# Patient Record
Sex: Female | Born: 1995 | Hispanic: No | Marital: Married | Smoking: Never smoker
Health system: Southern US, Community
[De-identification: ages and names within clinical notes are randomized; demographics above are authoritative.]

## PROBLEM LIST (undated history)

## (undated) DIAGNOSIS — E039 Hypothyroidism, unspecified: Secondary | ICD-10-CM

## (undated) DIAGNOSIS — J45909 Unspecified asthma, uncomplicated: Secondary | ICD-10-CM

---
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
## 2016-10-19 ENCOUNTER — Encounter (HOSPITAL_COMMUNITY): Payer: Self-pay | Admitting: Emergency Medicine

## 2016-10-19 ENCOUNTER — Emergency Department (HOSPITAL_COMMUNITY): Payer: Medicaid Other

## 2016-10-19 ENCOUNTER — Emergency Department (HOSPITAL_COMMUNITY)
Admission: EM | Admit: 2016-10-19 | Discharge: 2016-10-19 | Disposition: A | Discharge status: Home / Self Care | Payer: Medicaid Other | Attending: Emergency Medicine | Admitting: Emergency Medicine

## 2016-10-19 DIAGNOSIS — J45909 Unspecified asthma, uncomplicated: Secondary | ICD-10-CM | POA: Insufficient documentation

## 2016-10-19 DIAGNOSIS — M545 Low back pain, unspecified: Secondary | ICD-10-CM

## 2016-10-19 DIAGNOSIS — M7918 Myalgia, other site: Secondary | ICD-10-CM

## 2016-10-19 HISTORY — DX: Unspecified asthma, uncomplicated: J45.909

## 2016-10-19 LAB — POC URINE PREG, ED: PREG TEST UR: NEGATIVE

## 2016-10-19 MED ORDER — METHOCARBAMOL 500 MG PO TABS: 500.0000 mg | ORAL_TABLET | Freq: Two times a day (BID) | ORAL | 0 refills | Status: AC

## 2016-10-19 MED ORDER — IBUPROFEN 600 MG PO TABS: 600.0000 mg | ORAL_TABLET | Freq: Four times a day (QID) | ORAL | 0 refills | Status: AC | PRN
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
## 2016-10-19 NOTE — ED Notes (Signed)
Given water to drink for urine sample
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
## 2016-10-19 NOTE — ED Notes (Signed)
Papers reviewed with patient and she verbalizes understanding.  
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
## 2023-06-30 ENCOUNTER — Encounter (HOSPITAL_COMMUNITY): Payer: Self-pay | Admitting: Emergency Medicine

## 2023-06-30 ENCOUNTER — Ambulatory Visit (HOSPITAL_COMMUNITY)
Admission: EM | Admit: 2023-06-30 | Discharge: 2023-06-30 | Disposition: A | Discharge status: Home / Self Care | Payer: No Typology Code available for payment source | Attending: Nurse Practitioner | Admitting: Nurse Practitioner

## 2023-06-30 DIAGNOSIS — R0981 Nasal congestion: Secondary | ICD-10-CM

## 2023-06-30 LAB — POC COVID19/FLU A&B COMBO
Covid Antigen, POC: NEGATIVE
Influenza A Antigen, POC: NEGATIVE
Influenza B Antigen, POC: NEGATIVE

## 2023-06-30 LAB — POCT RAPID STREP A (OFFICE): Rapid Strep A Screen: NEGATIVE

## 2023-06-30 NOTE — ED Triage Notes (Signed)
Pt c/o calf cramps, cough, congestion, headache, fatigue, fever, brain fog, yellow eyes since last Monday.   She is asking for blood work.

## 2023-06-30 NOTE — ED Provider Notes (Signed)
[facility]    CSN: [id number] Arrival date & time: 06/30/23  1414      History   Chief Complaint No chief complaint on file.   HPI Alicia Peterson is a 27 y.o. female.   HPI  She is in today for evaluation of multiple symptoms.  She reports that she started with Pain and then it developed into generalized aching.  She has experienced some cough congestion headaches, brain fog fever and fatigue.  She is concerned that she may have COVID or flu.  She reports that she was getting eyelashes placed in a person states that her eyes were yellow and that she needs to get blood work.  She is wanting workup for anemia Past Medical History:  Diagnosis Date   Asthma     There are no active problems to display for this patient.   History reviewed. No pertinent surgical history.  OB History   No obstetric history on file.      Home Medications    Prior to Admission medications   Medication Sig Start Date End Date Taking? Authorizing Provider  ibuprofen (ADVIL,MOTRIN) 600 MG tablet Take 1 tablet (600 mg total) by mouth every 6 (six) hours as needed. 10/19/16   Audry Pili, PA-C  methocarbamol (ROBAXIN) 500 MG tablet Take 1 tablet (500 mg total) by mouth 2 (two) times daily. Patient not taking: Reported on 06/30/2023 10/19/16   Audry Pili, PA-C    Family History History reviewed. No pertinent family history.  Social History Social History   Tobacco Use   Smoking status: Never   Smokeless tobacco: Never  Substance Use Topics   Alcohol use: No   Drug use: No     Allergies   Patient has no known allergies.   Review of Systems Review of Systems   Physical Exam Triage Vital Signs ED Triage Vitals  Encounter Vitals Group     BP 06/30/23 1610 139/81     Systolic BP Percentile --      Diastolic BP Percentile --      Pulse Rate 06/30/23 1610 (!) 126     Resp 06/30/23 1610 16     Temp 06/30/23 1610 97.9 F (36.6 C)     Temp Source 06/30/23 1610  Oral     SpO2 06/30/23 1610 98 %     Weight --      Height --      Head Circumference --      Peak Flow --      Pain Score 06/30/23 1608 4     Pain Loc --      Pain Education --      Exclude from Growth Chart --    No data found.  Updated Vital Signs BP 139/81 (BP Location: Right Arm)   Pulse (!) 126   Temp 97.9 F (36.6 C) (Oral)   Resp 16   LMP 06/23/2023 (Approximate)   SpO2 98%   Visual Acuity Right Eye Distance:   Left Eye Distance:   Bilateral Distance:    Right Eye Near:   Left Eye Near:    Bilateral Near:     Physical Exam Constitutional:      Appearance: She is normal weight.  HENT:     Head: Normocephalic and atraumatic.     Right Ear: Tympanic membrane normal.     Left Ear: Tympanic membrane normal.     Nose: Nose normal.     Mouth/Throat:     Mouth:  Mucous membranes are moist.     Pharynx: No oropharyngeal exudate or posterior oropharyngeal erythema.  Cardiovascular:     Rate and Rhythm: Tachycardia present.     Pulses: Normal pulses.     Heart sounds: Normal heart sounds.  Pulmonary:     Effort: Pulmonary effort is normal.     Breath sounds: Normal breath sounds.  Abdominal:     General: Abdomen is flat.  Musculoskeletal:        General: Normal range of motion.     Cervical back: Normal range of motion.  Skin:    General: Skin is warm.     Capillary Refill: Capillary refill takes less than 2 seconds.  Neurological:     General: No focal deficit present.     Mental Status: She is alert and oriented to person, place, and time.  Psychiatric:     Comments: Anxious      UC Treatments / Results  Labs (all labs ordered are listed, but only abnormal results are displayed) Labs Reviewed  POC COVID19/FLU A&B COMBO  POCT RAPID STREP A (OFFICE)    EKG   Radiology No results found.  Procedures Procedures (including critical care time)  Medications Ordered in UC Medications - No data to display  Initial Impression / Assessment and  Plan / UC Course  I have reviewed the triage vital signs and the nursing notes.  Pertinent labs & imaging results that were available during my care of the patient were reviewed by me and considered in my medical decision making (see chart for details).     Congestion Final Clinical Impressions(s) / UC Diagnoses   Final diagnoses:  Nasal congestion     Discharge Instructions      Your COVID, Influenza and Strep Test are all negative. You may have an Upper Respiratory Infection We encourage conservative treatment with symptom relief. We encourage you to use Tylenol alternating with Ibuprofen for your fever if not contraindicated. (Remember to use as directed do not exceed daily dosing recommendations) We also encourage salt water gargles for your sore throat. You should also consider throat lozenges and chloraseptic spray.  Your cough can be soothed with a cough suppressant.  You have a referral for primary care for ongoing evaluation.    ED Prescriptions   None    PDMP not reviewed this encounter.   Thad Ranger [place], [place] 06/30/23 [phone]

## 2023-06-30 NOTE — Discharge Instructions (Addendum)
Your COVID, Influenza and Strep Test are all negative. You may have an Upper Respiratory Infection We encourage conservative treatment with symptom relief. We encourage you to use Tylenol alternating with Ibuprofen for your fever if not contraindicated. (Remember to use as directed do not exceed daily dosing recommendations) We also encourage salt water gargles for your sore throat. You should also consider throat lozenges and chloraseptic spray.  Your cough can be soothed with a cough suppressant.  You have a referral for primary care for ongoing evaluation.

## 2023-08-03 DIAGNOSIS — Z113 Encounter for screening for infections with a predominantly sexual mode of transmission: Secondary | ICD-10-CM | POA: Diagnosis not present

## 2023-08-03 DIAGNOSIS — Z3202 Encounter for pregnancy test, result negative: Secondary | ICD-10-CM | POA: Diagnosis not present

## 2023-08-03 DIAGNOSIS — Z01419 Encounter for gynecological examination (general) (routine) without abnormal findings: Secondary | ICD-10-CM | POA: Diagnosis not present

## 2023-08-03 DIAGNOSIS — Z114 Encounter for screening for human immunodeficiency virus [HIV]: Secondary | ICD-10-CM | POA: Diagnosis not present

## 2023-08-03 DIAGNOSIS — Z3009 Encounter for other general counseling and advice on contraception: Secondary | ICD-10-CM | POA: Diagnosis not present

## 2023-11-13 ENCOUNTER — Ambulatory Visit (HOSPITAL_COMMUNITY): Admission: EM | Admit: 2023-11-13 | Discharge: 2023-11-13 | Disposition: A | Discharge status: Home / Self Care

## 2023-11-13 ENCOUNTER — Emergency Department (HOSPITAL_COMMUNITY)
Admission: EM | Admit: 2023-11-13 | Discharge: 2023-11-14 | Disposition: A | Discharge status: Home / Self Care | Attending: Emergency Medicine | Admitting: Emergency Medicine

## 2023-11-13 ENCOUNTER — Other Ambulatory Visit: Payer: Self-pay

## 2023-11-13 ENCOUNTER — Encounter (HOSPITAL_COMMUNITY): Payer: Self-pay | Admitting: Emergency Medicine

## 2023-11-13 DIAGNOSIS — R109 Unspecified abdominal pain: Secondary | ICD-10-CM

## 2023-11-13 DIAGNOSIS — R110 Nausea: Secondary | ICD-10-CM | POA: Diagnosis not present

## 2023-11-13 DIAGNOSIS — R1011 Right upper quadrant pain: Secondary | ICD-10-CM

## 2023-11-13 DIAGNOSIS — R1012 Left upper quadrant pain: Secondary | ICD-10-CM | POA: Insufficient documentation

## 2023-11-13 DIAGNOSIS — R1032 Left lower quadrant pain: Secondary | ICD-10-CM | POA: Diagnosis not present

## 2023-11-13 DIAGNOSIS — N3289 Other specified disorders of bladder: Secondary | ICD-10-CM | POA: Diagnosis not present

## 2023-11-13 LAB — COMPREHENSIVE METABOLIC PANEL WITH GFR
ALT: 46 U/L — ABNORMAL HIGH (ref 0–44)
AST: 41 U/L (ref 15–41)
Albumin: 3.7 g/dL (ref 3.5–5.0)
Alkaline Phosphatase: 118 U/L (ref 38–126)
Anion gap: 9 (ref 5–15)
BUN: 8 mg/dL (ref 6–20)
CO2: 24 mmol/L (ref 22–32)
Calcium: 9.1 mg/dL (ref 8.9–10.3)
Chloride: 105 mmol/L (ref 98–111)
Creatinine, Ser: 0.37 mg/dL — ABNORMAL LOW (ref 0.44–1.00)
GFR, Estimated: >60 mL/min (ref >60)
Glucose, Bld: 135 mg/dL — ABNORMAL HIGH (ref 70–99)
Potassium: 4.5 mmol/L (ref 3.5–5.1)
Sodium: 138 mmol/L (ref 135–145)
Total Bilirubin: 0.3 mg/dL (ref 0.0–1.2)
Total Protein: 5.7 g/dL — ABNORMAL LOW (ref 6.5–8.1)

## 2023-11-13 LAB — CBC
HCT: 39 % (ref 36.0–46.0)
Hemoglobin: 12.8 g/dL (ref 12.0–15.0)
MCH: 27.5 pg (ref 26.0–34.0)
MCHC: 32.8 g/dL (ref 30.0–36.0)
MCV: 83.7 fL (ref 80.0–100.0)
Platelets: 313 10*3/uL (ref 150–400)
RBC: 4.66 MIL/uL (ref 3.87–5.11)
RDW: 12.7 % (ref 11.5–15.5)
WBC: 8.7 10*3/uL (ref 4.0–10.5)
nRBC: 0 % (ref 0.0–0.2)

## 2023-11-13 LAB — URINALYSIS, ROUTINE W REFLEX MICROSCOPIC
Bilirubin Urine: NEGATIVE
Glucose, UA: NEGATIVE mg/dL
Hgb urine dipstick: NEGATIVE
Ketones, ur: 5 mg/dL — AB
Leukocytes,Ua: NEGATIVE
Nitrite: POSITIVE — AB
Protein, ur: NEGATIVE mg/dL
Specific Gravity, Urine: 1.019 (ref 1.005–1.030)
pH: 7 (ref 5.0–8.0)

## 2023-11-13 LAB — HCG, SERUM, QUALITATIVE: Preg, Serum: NEGATIVE

## 2023-11-13 LAB — LIPASE, BLOOD: Lipase: 35 U/L (ref 11–51)

## 2023-11-13 NOTE — ED Triage Notes (Addendum)
 Pt reports last week started having left abd pains when eating. Reports pain will get so bad can only eat just few bites and has to stop. Denies n/v, urinary or bowel problems. Was seen somewhere last week and prescribed antibiotics for UTI but was told that pain not related to UTI. Tried tylenol that doesn't help.   Pt has a PCP appt set up but not until the 20 something of this month.

## 2023-11-13 NOTE — ED Notes (Signed)
 Patient is being discharged from the Urgent Care and sent to the Emergency Department via POV with family. Per Levora Reas, NP, patient is in need of higher level of care due to abdominal pain. Patient is aware and verbalizes understanding of plan of care.  Vitals:   11/13/23 1929  BP: 116/71  Pulse: 94  Resp: 15  Temp: 98.3 F (36.8 C)  SpO2: 98%

## 2023-11-13 NOTE — Discharge Instructions (Addendum)
Go to the ER for further evaluation of your abdominal pain. °

## 2023-11-13 NOTE — ED Triage Notes (Signed)
 Patient coming to ED for evaluation of abdominal pain.  Reports symptoms started last week.  Had blood work completed recently and was told that "she needed to have more labs completed because something was abnormal with liver results."  Was seen at Urgent Care today for evaluation and sent here for evaluation.  Urgent Care was concerned for gallbladder issues due to positive Murphy's sign.  Yesterday noticed she had chills and possible fever.

## 2023-11-13 NOTE — ED Provider Notes (Signed)
 [facility]    CSN: [id number] Arrival date & time: 11/13/23  1803      History   Chief Complaint Chief Complaint  Patient presents with   Abdominal Pain    HPI Alicia Peterson is a 28 y.o. female.   Patient presents with left-sided abdominal pain that worsens with eating x 1 week.  Patient states the pain becomes so severe that she has to stop eating in the middle of a meal.  Denies nausea, vomiting, diarrhea, constipation, and blood in stool.  Patient states that she has felt like she had a fever yesterday and 1 day last week.  Denies checking her temperature to confirm presence of fever.  Patient states that she was recently prescribed antibiotics for a urinary tract infection, but was also told that the pain that she is having now is not related to a urinary tract infection.  Patient reports taking Tylenol with minimal relief.  The history is provided by the patient and medical records.  Abdominal Pain   Past Medical History:  Diagnosis Date   Asthma     There are no active problems to display for this patient.   History reviewed. No pertinent surgical history.  OB History   No obstetric history on file.      Home Medications    Prior to Admission medications   Medication Sig Start Date End Date Taking? Authorizing Provider  MACROBID 100 MG capsule Take 1 capsule every 12 hours by oral route for 5 days. 11/07/23  Yes [provider]  phenazopyridine (PYRIDIUM) 200 MG tablet Take 1 tablet 3 times a day by oral route for 2 days. 11/07/23  Yes [provider]  ibuprofen  (ADVIL ,MOTRIN ) 600 MG tablet Take 1 tablet (600 mg total) by mouth every 6 (six) hours as needed. 10/19/16   Tonya Fredrickson, PA-C  methocarbamol  (ROBAXIN ) 500 MG tablet Take 1 tablet (500 mg total) by mouth 2 (two) times daily. Patient not taking: Reported on 06/30/2023 10/19/16   Tonya Fredrickson, PA-C    Family History No family history on file.  Social  History Social History   Tobacco Use   Smoking status: Never   Smokeless tobacco: Never  Substance Use Topics   Alcohol use: No   Drug use: No     Allergies   Patient has no known allergies.   Review of Systems Review of Systems  Gastrointestinal:  Positive for abdominal pain.   Per HPI  Physical Exam Triage Vital Signs ED Triage Vitals  Encounter Vitals Group     BP 11/13/23 1929 116/71     Systolic BP Percentile --      Diastolic BP Percentile --      Pulse Rate 11/13/23 1929 94     Resp 11/13/23 1929 15     Temp 11/13/23 1929 98.3 F (36.8 C)     Temp Source 11/13/23 1929 Oral     SpO2 11/13/23 1929 98 %     Weight --      Height --      Head Circumference --      Peak Flow --      Pain Score 11/13/23 1927 0     Pain Loc --      Pain Education --      Exclude from Growth Chart --    No data found.  Updated Vital Signs BP 116/71 (BP Location: Right Arm)   Pulse 94   Temp 98.3 F (36.8 C) (Oral)  Resp 15   LMP 10/30/2023 (Approximate)   SpO2 98%   Visual Acuity Right Eye Distance:   Left Eye Distance:   Bilateral Distance:    Right Eye Near:   Left Eye Near:    Bilateral Near:     Physical Exam Vitals and nursing note reviewed.  Constitutional:      General: She is awake. She is not in acute distress.    Appearance: Normal appearance. She is well-developed and well-groomed. She is not ill-appearing.  Abdominal:     General: Abdomen is flat. Bowel sounds are normal.     Palpations: Abdomen is soft.     Tenderness: There is abdominal tenderness in the right upper quadrant and epigastric area. There is guarding. Positive signs include Murphy's sign.  Skin:    General: Skin is warm and dry.  Neurological:     Mental Status: She is alert.  Psychiatric:        Behavior: Behavior is cooperative.      UC Treatments / Results  Labs (all labs ordered are listed, but only abnormal results are displayed) Labs Reviewed - No data to  display  EKG   Radiology No results found.  Procedures Procedures (including critical care time)  Medications Ordered in UC Medications - No data to display  Initial Impression / Assessment and Plan / UC Course  I have reviewed the triage vital signs and the nursing notes.  Pertinent labs & imaging results that were available during my care of the patient were reviewed by me and considered in my medical decision making (see chart for details).     Patient is well-appearing.  Vitals are stable.  Upon assessment patient is significantly tender upon palpation to right upper quadrant epigastric region with guarding present.  Positive Murphy sign noted.  Recommended patient be seen in the ER for further evaluation of abdominal pain to rule out gallstones or cholecystitis.  Patient is agreeable to plan at this time.  Patient is stable to arrived to the ER via POV. Final Clinical Impressions(s) / UC Diagnoses   Final diagnoses:  Right upper quadrant abdominal pain     Discharge Instructions      Go to the ER for further evaluation of your abdominal pain.    ED Prescriptions   None    PDMP not reviewed this encounter.   Levora Reas A, NP 11/13/23 [phone]

## 2023-11-14 ENCOUNTER — Emergency Department (HOSPITAL_COMMUNITY)

## 2023-11-14 DIAGNOSIS — R1011 Right upper quadrant pain: Secondary | ICD-10-CM | POA: Diagnosis not present

## 2023-11-14 DIAGNOSIS — N3289 Other specified disorders of bladder: Secondary | ICD-10-CM | POA: Diagnosis not present

## 2023-11-14 DIAGNOSIS — R1032 Left lower quadrant pain: Secondary | ICD-10-CM | POA: Diagnosis not present

## 2023-11-14 MED ORDER — SODIUM CHLORIDE 0.9 % IV BOLUS
500.0000 mL | Freq: Once | INTRAVENOUS | Status: AC
  Administered 2023-11-14: 500 mL via INTRAVENOUS

## 2023-11-14 MED ORDER — IOHEXOL 350 MG/ML SOLN
75.0000 mL | Freq: Once | INTRAVENOUS | Status: AC | PRN
  Administered 2023-11-14: 75 mL via INTRAVENOUS

## 2023-11-14 MED ORDER — DICYCLOMINE HCL 20 MG PO TABS: 20.0000 mg | ORAL_TABLET | Freq: Two times a day (BID) | ORAL | 0 refills | Status: AC

## 2023-11-14 MED ORDER — ONDANSETRON 8 MG PO TBDP: 8.0000 mg | ORAL_TABLET | Freq: Three times a day (TID) | ORAL | 0 refills | Status: AC | PRN

## 2023-11-14 MED ORDER — PANTOPRAZOLE SODIUM 40 MG PO TBEC: 40.0000 mg | DELAYED_RELEASE_TABLET | Freq: Every day | ORAL | 0 refills | Status: AC

## 2023-11-14 NOTE — Discharge Instructions (Addendum)
 Take the medications as prescribed to help with your symptoms.  Follow-up with a primary care doctor to be rechecked to make sure the symptoms resolve.

## 2023-11-14 NOTE — ED Provider Notes (Signed)
 Patient was initially seen by Dr. Alayne Hubert.  Please see her note.  Plan was to follow-up on the ultrasound and started on PPI if negative.  Patient's laboratory tests were overall unremarkable.  CT abdomen pelvis does not show any acute abnormality.  Ultrasound does not show any acute abnormality.  Will plan on discharge home with antacid medication.   Trish Furl, MD 11/14/23 [phone]

## 2023-11-14 NOTE — ED Provider Notes (Signed)
 [place] [facility] AT [facility] Provider Note   CSN: [id number] Arrival date & time: 11/13/23  1953     History  Chief Complaint  Patient presents with   Abdominal Pain    Alicia Peterson is a 28 y.o. female.  The history is provided by the patient.  Abdominal Pain Alicia Peterson is a 28 y.o. female who presents to the Emergency Department complaining of abdominal pain.  She presents to the emergency department for evaluation of 1 week of left-sided abdominal pain.  Pain is worse with eating.  After eating she becomes full rapidly with abdominal bloating and it takes about 30 minutes for her pain to improve.  She has decreased appetite.  She has a subjective fever on Sunday, none since then.  She has nausea but no vomiting, diarrhea, dysuria.  She has no known medical problems and takes no routine medications.  She did see her PCP 1 week ago for routine blood work and had a mildly elevated AST.  She was started on Macrobid for UTI at that time.    Home Medications Prior to Admission medications   Medication Sig Start Date End Date Taking? Authorizing Provider  ibuprofen  (ADVIL ,MOTRIN ) 600 MG tablet Take 1 tablet (600 mg total) by mouth every 6 (six) hours as needed. 10/19/16   Tonya Fredrickson, PA-C  MACROBID 100 MG capsule Take 1 capsule every 12 hours by oral route for 5 days. 11/07/23   [provider]  methocarbamol  (ROBAXIN ) 500 MG tablet Take 1 tablet (500 mg total) by mouth 2 (two) times daily. Patient not taking: Reported on 06/30/2023 10/19/16   Tonya Fredrickson, PA-C  phenazopyridine (PYRIDIUM) 200 MG tablet Take 1 tablet 3 times a day by oral route for 2 days. 11/07/23   [provider]      Allergies    Patient has no known allergies.    Review of Systems   Review of Systems  Gastrointestinal:  Positive for abdominal pain.  All other systems reviewed and are negative.   Physical Exam Updated Vital Signs BP 119/68   Pulse  (!) 103   Temp 98.2 F (36.8 C)   Resp 16   Ht 5\' 8"  (1.727 m)   Wt 64.4 kg   LMP 10/30/2023 (Approximate)   SpO2 100%   BMI 21.59 kg/m  Physical Exam Vitals and nursing note reviewed.  Constitutional:      Appearance: She is well-developed.  HENT:     Head: Normocephalic and atraumatic.  Cardiovascular:     Rate and Rhythm: Normal rate and regular rhythm.  Pulmonary:     Effort: Pulmonary effort is normal. No respiratory distress.     Breath sounds: Normal breath sounds.  Abdominal:     Palpations: Abdomen is soft.     Tenderness: There is no guarding or rebound.     Comments: Mild left sided abdominal tenderness  Musculoskeletal:        General: No tenderness.  Skin:    General: Skin is warm and dry.  Neurological:     Mental Status: She is alert and oriented to person, place, and time.  Psychiatric:        Behavior: Behavior normal.     ED Results / Procedures / Treatments   Labs (all labs ordered are listed, but only abnormal results are displayed) Labs Reviewed  COMPREHENSIVE METABOLIC PANEL WITH GFR - Abnormal; Notable for the following components:      Result Value  Glucose, Bld 135 (*)    Creatinine, Ser 0.37 (*)    Total Protein 5.7 (*)    ALT 46 (*)    All other components within normal limits  URINALYSIS, ROUTINE W REFLEX MICROSCOPIC - Abnormal; Notable for the following components:   Color, Urine AMBER (*)    APPearance CLOUDY (*)    Ketones, ur 5 (*)    Nitrite POSITIVE (*)    Bacteria, UA RARE (*)    All other components within normal limits  LIPASE, BLOOD  CBC  HCG, SERUM, QUALITATIVE    EKG None  Radiology US  Abdomen Limited RUQ (LIVER/GB) Result Date: 11/14/2023 CLINICAL DATA:  Right upper quadrant pain EXAM: ULTRASOUND ABDOMEN LIMITED RIGHT UPPER QUADRANT COMPARISON:  None Available. FINDINGS: Gallbladder: No gallstones or wall thickening visualized. No sonographic Murphy sign noted by sonographer. Common bile duct: Diameter: 3.3 mm  Liver: No focal lesion identified. Within normal limits in parenchymal echogenicity. Portal vein is patent on color Doppler imaging with normal direction of blood flow towards the liver. Other: None. IMPRESSION: No acute abnormality noted. Electronically Signed   By: Violeta Grey M.D.   On: 11/14/2023 02:23    Procedures Procedures    Medications Ordered in ED Medications  sodium chloride 0.9 % bolus 500 mL (500 mLs Intravenous New Bag/Given 11/14/23 0641)  iohexol (OMNIPAQUE) 350 MG/ML injection 75 mL (75 mLs Intravenous Contrast Given 11/14/23 0802)    ED Course/ Medical Decision Making/ A&P                                 Medical Decision Making Amount and/or Complexity of Data Reviewed Labs: ordered. Radiology: ordered.  Risk Prescription drug management.   Patient here for evaluation of 1 week of the left upper quadrant pain, worse with meals.  She does have tenderness on examination without peritoneal findings.  Right upper quadrant ultrasound is negative for acute abnormality.  Plan to obtain CT abdomen pelvis to further evaluate her pain.  Pt care transferred pending CT.          Final Clinical Impression(s) / ED Diagnoses Final diagnoses:  None    Rx / DC Orders ED Discharge Orders     None         Kelsey Patricia, MD 11/14/23 [phone]

## 2023-11-30 DIAGNOSIS — R110 Nausea: Secondary | ICD-10-CM | POA: Diagnosis not present

## 2023-11-30 DIAGNOSIS — R5383 Other fatigue: Secondary | ICD-10-CM | POA: Diagnosis not present

## 2023-11-30 DIAGNOSIS — E0590 Thyrotoxicosis, unspecified without thyrotoxic crisis or storm: Secondary | ICD-10-CM | POA: Diagnosis not present

## 2023-11-30 DIAGNOSIS — R140 Abdominal distension (gaseous): Secondary | ICD-10-CM | POA: Diagnosis not present

## 2023-11-30 DIAGNOSIS — B353 Tinea pedis: Secondary | ICD-10-CM | POA: Diagnosis not present

## 2024-01-02 ENCOUNTER — Other Ambulatory Visit: Payer: Self-pay | Admitting: Family

## 2024-01-02 DIAGNOSIS — E0590 Thyrotoxicosis, unspecified without thyrotoxic crisis or storm: Secondary | ICD-10-CM

## 2024-01-05 ENCOUNTER — Ambulatory Visit
Admission: RE | Admit: 2024-01-05 | Discharge: 2024-01-05 | Disposition: A | Discharge status: Home / Self Care | Source: Ambulatory Visit | Attending: Family | Admitting: Family

## 2024-01-05 DIAGNOSIS — E0590 Thyrotoxicosis, unspecified without thyrotoxic crisis or storm: Secondary | ICD-10-CM

## 2024-01-24 DIAGNOSIS — E0590 Thyrotoxicosis, unspecified without thyrotoxic crisis or storm: Secondary | ICD-10-CM | POA: Diagnosis not present

## 2024-03-19 DIAGNOSIS — E0590 Thyrotoxicosis, unspecified without thyrotoxic crisis or storm: Secondary | ICD-10-CM | POA: Diagnosis not present

## 2024-05-17 ENCOUNTER — Encounter (HOSPITAL_COMMUNITY): Payer: Self-pay

## 2024-05-17 ENCOUNTER — Ambulatory Visit (HOSPITAL_COMMUNITY)
Admission: RE | Admit: 2024-05-17 | Discharge: 2024-05-17 | Disposition: A | Discharge status: Home / Self Care | Payer: Self-pay | Source: Ambulatory Visit | Attending: Emergency Medicine | Admitting: Emergency Medicine

## 2024-05-17 VITALS — BP 117/80 | HR 98 | Temp 97.9°F | Resp 16

## 2024-05-17 DIAGNOSIS — N3000 Acute cystitis without hematuria: Secondary | ICD-10-CM

## 2024-05-17 DIAGNOSIS — R350 Frequency of micturition: Secondary | ICD-10-CM | POA: Diagnosis present

## 2024-05-17 DIAGNOSIS — R300 Dysuria: Secondary | ICD-10-CM | POA: Diagnosis not present

## 2024-05-17 HISTORY — DX: Hypothyroidism, unspecified: E03.9

## 2024-05-17 LAB — POCT URINALYSIS DIP (MANUAL ENTRY)
Bilirubin, UA: NEGATIVE
Glucose, UA: NEGATIVE mg/dL
Ketones, POC UA: NEGATIVE mg/dL
Leukocytes, UA: NEGATIVE
Nitrite, UA: NEGATIVE
Protein Ur, POC: NEGATIVE mg/dL
Spec Grav, UA: 1.01 (ref 1.010–1.025)
Urobilinogen, UA: 0.2 U/dL
pH, UA: 7 (ref 5.0–8.0)

## 2024-05-17 MED ORDER — SULFAMETHOXAZOLE-TRIMETHOPRIM 800-160 MG PO TABS: 1.0000 | ORAL_TABLET | Freq: Two times a day (BID) | ORAL | 0 refills | Status: AC

## 2024-05-17 NOTE — ED Triage Notes (Signed)
 Pt states urinary frequency with some burning with urination for the past 5 days.  States she has been taking AZO at home.

## 2024-05-17 NOTE — ED Provider Notes (Signed)
 [facility]    CSN: [id number] Arrival date & time: 05/17/24  1855      History   Chief Complaint Chief Complaint  Patient presents with   Urinary Frequency    Entered by patient    HPI Alicia Peterson is a 28 y.o. female.   Patient presents with urinary frequency, dysuria, and urinary urgency x 5 days.  Patient denies any hematuria, abnormal vaginal discharge, vaginal irritation/itching, abnormal vaginal bleeding, abdominal pain, flank pain, nausea, vomiting, or fever.  Patient reports that she has been taking Azo with minimal relief of her symptoms.  LMP 10/31  The history is provided by the patient and medical records.  Urinary Frequency    Past Medical History:  Diagnosis Date   Asthma    Hypothyroid     There are no active problems to display for this patient.   History reviewed. No pertinent surgical history.  OB History   No obstetric history on file.      Home Medications    Prior to Admission medications   Medication Sig Start Date End Date Taking? Authorizing Provider  sulfamethoxazole-trimethoprim (BACTRIM DS) 800-160 MG tablet Take 1 tablet by mouth 2 (two) times daily for 3 days. 05/17/24 05/20/24 Yes Loreto Loescher A, NP  dicyclomine  (BENTYL ) 20 MG tablet Take 1 tablet (20 mg total) by mouth 2 (two) times daily. 11/14/23   Randol Simmonds, MD  ibuprofen  (ADVIL ,MOTRIN ) 600 MG tablet Take 1 tablet (600 mg total) by mouth every 6 (six) hours as needed. 10/19/16   Olympia Gee, PA-C  methimazole (TAPAZOLE) 10 MG tablet Take 10 mg by mouth daily.    [provider]  methocarbamol  (ROBAXIN ) 500 MG tablet Take 1 tablet (500 mg total) by mouth 2 (two) times daily. Patient not taking: Reported on 06/30/2023 10/19/16   Olympia Gee, PA-C  ondansetron  (ZOFRAN -ODT) 8 MG disintegrating tablet Take 1 tablet (8 mg total) by mouth every 8 (eight) hours as needed for nausea or vomiting. 11/14/23   Randol Simmonds, MD  pantoprazole  (PROTONIX ) 40 MG tablet  Take 1 tablet (40 mg total) by mouth daily. 11/14/23   Randol Simmonds, MD    Family History History reviewed. No pertinent family history.  Social History Social History   Tobacco Use   Smoking status: Never   Smokeless tobacco: Never  Substance Use Topics   Alcohol use: No   Drug use: No     Allergies   Patient has no known allergies.   Review of Systems Review of Systems  Genitourinary:  Positive for frequency.   Per HPI  Physical Exam Triage Vital Signs ED Triage Vitals  Encounter Vitals Group     BP 05/17/24 1913 117/80     Girls Systolic BP Percentile --      Girls Diastolic BP Percentile --      Boys Systolic BP Percentile --      Boys Diastolic BP Percentile --      Pulse Rate 05/17/24 1913 98     Resp 05/17/24 1913 16     Temp 05/17/24 1913 97.9 F (36.6 C)     Temp Source 05/17/24 1913 Oral     SpO2 05/17/24 1913 94 %     Weight --      Height --      Head Circumference --      Peak Flow --      Pain Score 05/17/24 1912 0     Pain Loc --  Pain Education --      Exclude from Growth Chart --    No data found.  Updated Vital Signs BP 117/80 (BP Location: Left Arm)   Pulse 98   Temp 97.9 F (36.6 C) (Oral)   Resp 16   LMP 05/10/2024 (Approximate)   SpO2 94%   Visual Acuity Right Eye Distance:   Left Eye Distance:   Bilateral Distance:    Right Eye Near:   Left Eye Near:    Bilateral Near:     Physical Exam Vitals and nursing note reviewed.  Constitutional:      General: She is awake. She is not in acute distress.    Appearance: Normal appearance. She is well-developed and well-groomed. She is not ill-appearing.  Abdominal:     General: Abdomen is flat. Bowel sounds are normal. There is no distension.     Palpations: Abdomen is soft.     Tenderness: There is no abdominal tenderness. There is no right CVA tenderness, left CVA tenderness, guarding or rebound.  Skin:    General: Skin is warm and dry.  Neurological:     Mental Status:  She is alert.  Psychiatric:        Behavior: Behavior is cooperative.      UC Treatments / Results  Labs (all labs ordered are listed, but only abnormal results are displayed) Labs Reviewed  POCT URINALYSIS DIP (MANUAL ENTRY) - Abnormal; Notable for the following components:      Result Value   Clarity, UA cloudy (*)    Blood, UA trace-lysed (*)    All other components within normal limits  URINE CULTURE    EKG   Radiology No results found.  Procedures Procedures (including critical care time)  Medications Ordered in UC Medications - No data to display  Initial Impression / Assessment and Plan / UC Course  I have reviewed the triage vital signs and the nursing notes.  Pertinent labs & imaging results that were available during my care of the patient were reviewed by me and considered in my medical decision making (see chart for details).     Patient is overall well-appearing.  Vitals are stable.  No significant findings noted on exam.  Urinalysis only revealed trace lysed RBCs, will send urine culture to confirm presence of urinary tract infection.  Based on active symptoms empirically treating for acute cystitis with Bactrim.  Discussed follow-up and return precautions. Final Clinical Impressions(s) / UC Diagnoses   Final diagnoses:  Acute cystitis without hematuria  Dysuria     Discharge Instructions      Start taking Bactrim twice daily for 3 days for urinary tract infection. Your urine culture results will return over the next few days and someone will call if results require any adjustment in treatment. Follow-up with your primary care provider or return here as needed.   ED Prescriptions     Medication Sig Dispense Auth. Provider   sulfamethoxazole-trimethoprim (BACTRIM DS) 800-160 MG tablet Take 1 tablet by mouth 2 (two) times daily for 3 days. 6 tablet Johnie Flaming A, NP      PDMP not reviewed this encounter.   Johnie Flaming A,  NP 05/17/24 1944

## 2024-05-17 NOTE — Discharge Instructions (Signed)
 Start taking Bactrim twice daily for 3 days for urinary tract infection. Your urine culture results will return over the next few days and someone will call if results require any adjustment in treatment. Follow-up with your primary care provider or return here as needed.

## 2024-05-19 LAB — URINE CULTURE: Culture: 70000 — AB

## 2024-05-20 ENCOUNTER — Ambulatory Visit (HOSPITAL_COMMUNITY): Payer: Self-pay
